# Patient Record
Sex: Male | Born: 1970 | Race: White | Hispanic: No | Marital: Married | Smoking: Never smoker
Health system: Southern US, Community
[De-identification: ages and names within clinical notes are randomized; demographics above are authoritative.]

---

## 2011-04-13 ENCOUNTER — Inpatient Hospital Stay (INDEPENDENT_AMBULATORY_CARE_PROVIDER_SITE_OTHER)
Admission: RE | Admit: 2011-04-13 | Discharge: 2011-04-13 | Disposition: A | Discharge status: Home / Self Care | Payer: BC Managed Care – HMO | Source: Ambulatory Visit | Attending: Family Medicine | Admitting: Family Medicine

## 2011-04-13 DIAGNOSIS — S61209A Unspecified open wound of unspecified finger without damage to nail, initial encounter: Secondary | ICD-10-CM

## 2014-01-12 ENCOUNTER — Telehealth: Payer: Self-pay

## 2014-01-12 MED ORDER — HYDROCHLOROTHIAZIDE 12.5 MG PO CAPS: 12.5000 mg | ORAL_CAPSULE | Freq: Every day | ORAL | Status: DC

## 2014-01-12 NOTE — Telephone Encounter (Signed)
Refilled HCTZ

## 2014-07-10 ENCOUNTER — Other Ambulatory Visit: Payer: Self-pay | Admitting: Cardiology

## 2014-08-05 ENCOUNTER — Other Ambulatory Visit: Payer: Self-pay | Admitting: Cardiology

## 2014-08-08 NOTE — Telephone Encounter (Signed)
Send to PCP Dr. Lupe Carney. Pt last seen Dr. Anne Fu 8/14 and pt only follows up as needed with cardiology.

## 2015-07-06 ENCOUNTER — Ambulatory Visit
Admission: RE | Admit: 2015-07-06 | Discharge: 2015-07-06 | Disposition: A | Discharge status: Home / Self Care | Payer: BC Managed Care – PPO | Source: Ambulatory Visit | Attending: Family Medicine | Admitting: Family Medicine

## 2015-07-06 ENCOUNTER — Other Ambulatory Visit: Payer: Self-pay | Admitting: Family Medicine

## 2015-07-06 DIAGNOSIS — R52 Pain, unspecified: Secondary | ICD-10-CM

## 2015-07-09 ENCOUNTER — Other Ambulatory Visit: Payer: Self-pay | Admitting: Family Medicine

## 2015-07-09 DIAGNOSIS — R7989 Other specified abnormal findings of blood chemistry: Secondary | ICD-10-CM

## 2015-07-09 DIAGNOSIS — D649 Anemia, unspecified: Secondary | ICD-10-CM

## 2015-07-09 DIAGNOSIS — R945 Abnormal results of liver function studies: Principal | ICD-10-CM

## 2015-07-10 ENCOUNTER — Ambulatory Visit
Admission: RE | Admit: 2015-07-10 | Discharge: 2015-07-10 | Disposition: A | Discharge status: Home / Self Care | Payer: BC Managed Care – PPO | Source: Ambulatory Visit | Attending: Family Medicine | Admitting: Family Medicine

## 2015-07-10 DIAGNOSIS — R7989 Other specified abnormal findings of blood chemistry: Secondary | ICD-10-CM

## 2015-07-10 DIAGNOSIS — D649 Anemia, unspecified: Secondary | ICD-10-CM

## 2015-07-10 DIAGNOSIS — R945 Abnormal results of liver function studies: Principal | ICD-10-CM

## 2016-01-08 IMAGING — US US ABDOMEN COMPLETE
1 series · 14 of 25 positions shown · non-contrast
Comparison: None.

CLINICAL DATA: Elevated LFTs, anemia

EXAM:
ULTRASOUND ABDOMEN COMPLETE

[Series 1: us abdomen complete · 0.30mm/px · 14 of 99 slices shown]
[im 1/99]
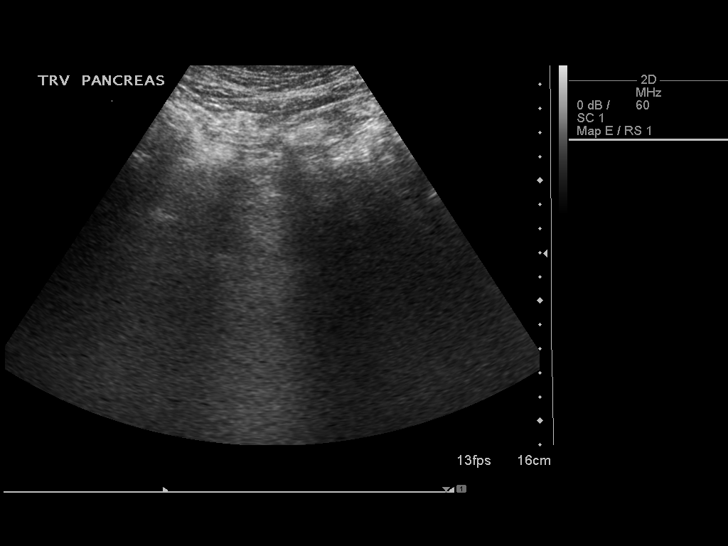
[im 9/99]
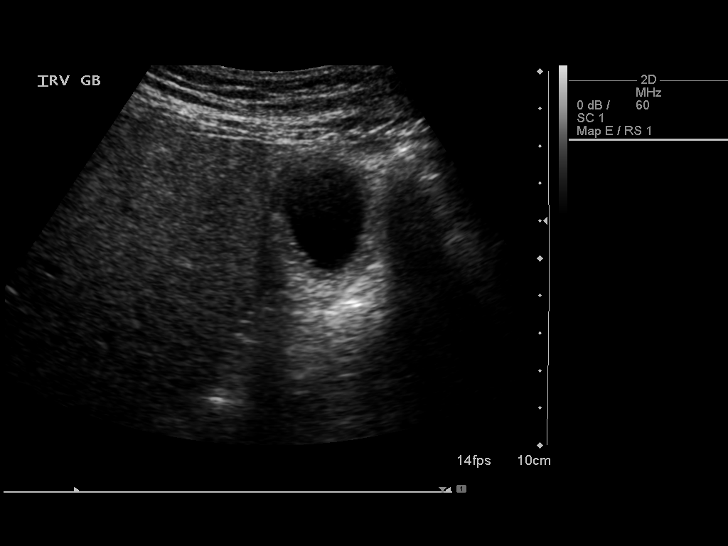
[im 17/99]
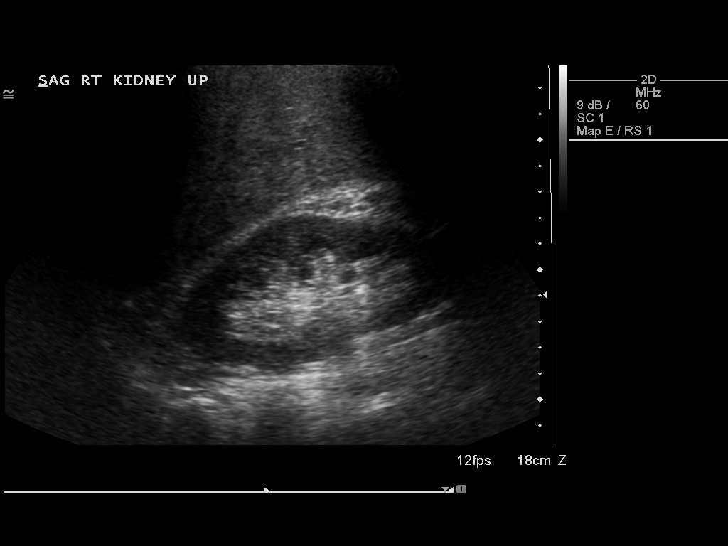
[im 25/99]
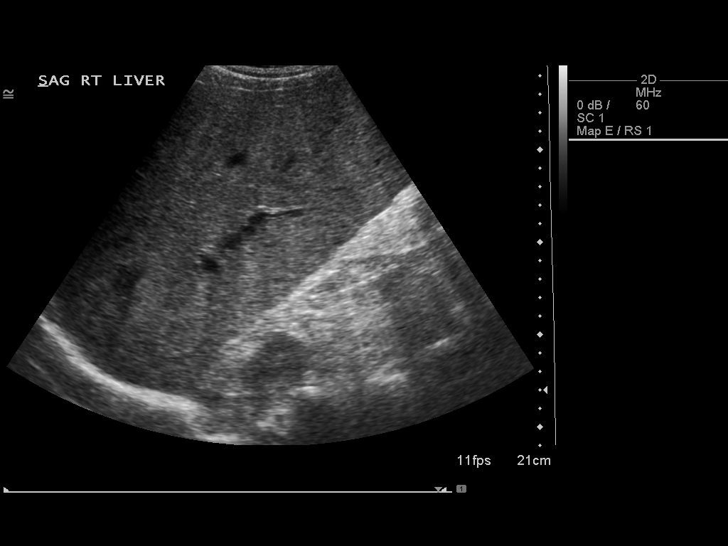
[im 33/99]
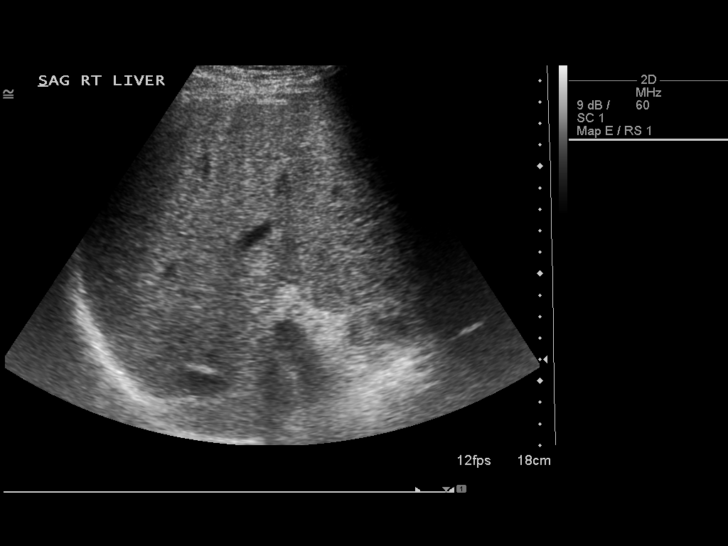
[im 37/99]
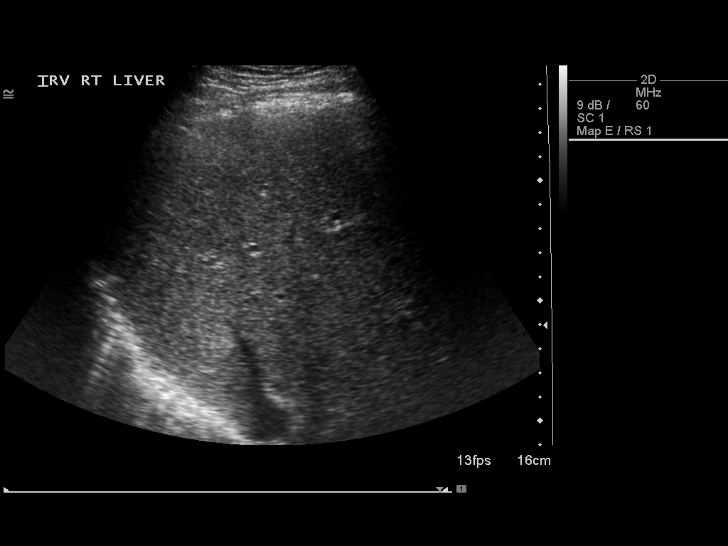
[im 45/99]
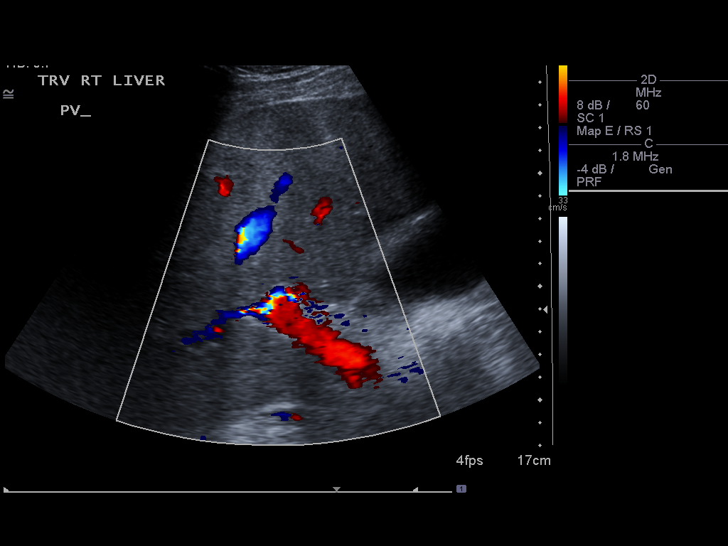
[im 54/99]
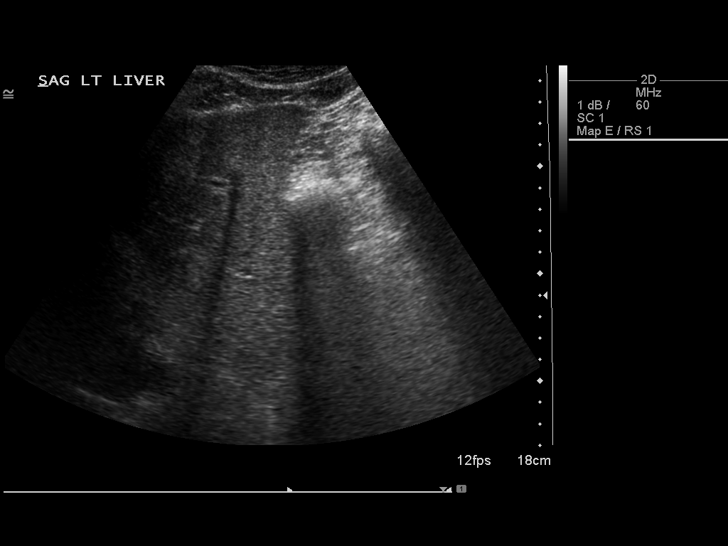
[im 62/99]
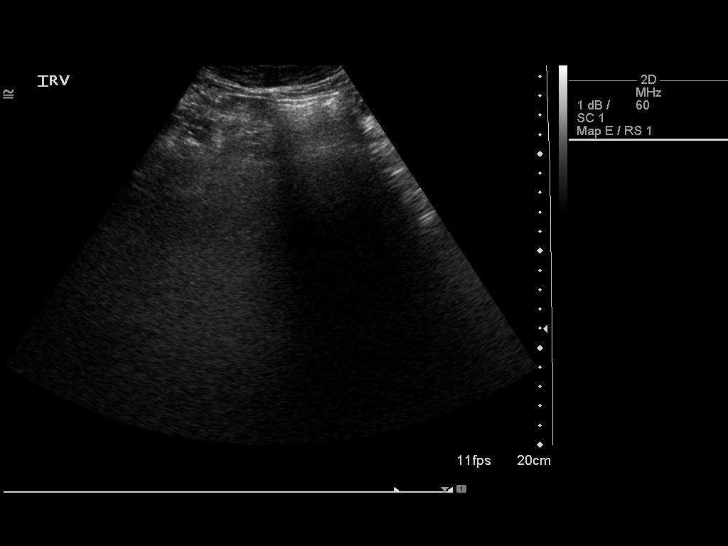
[im 66/99]
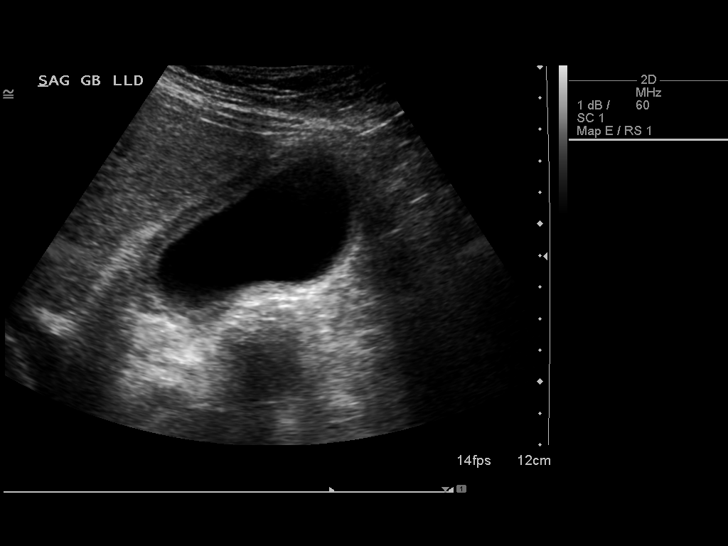
[im 74/99]
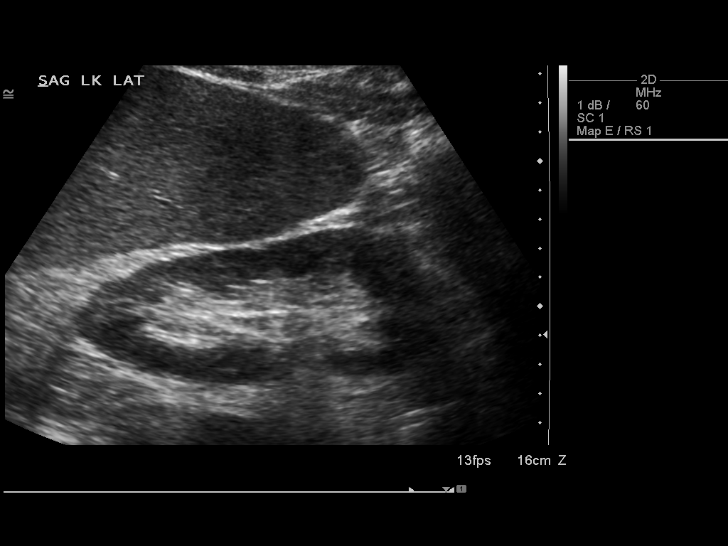
[im 82/99]
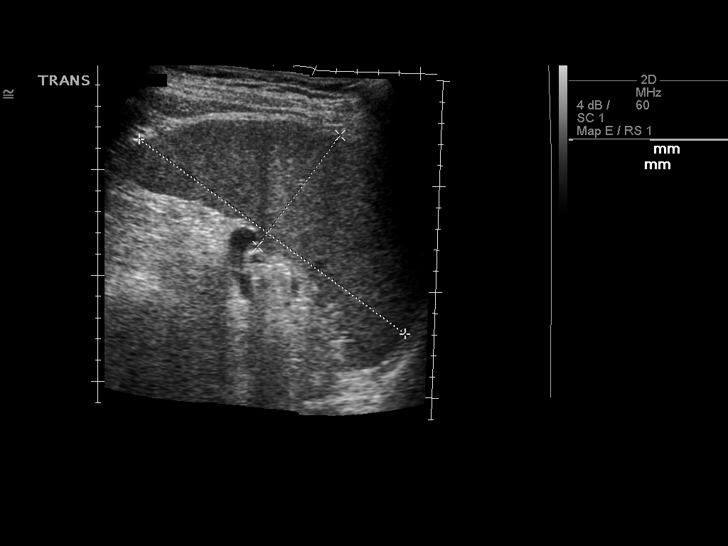
[im 90/99]
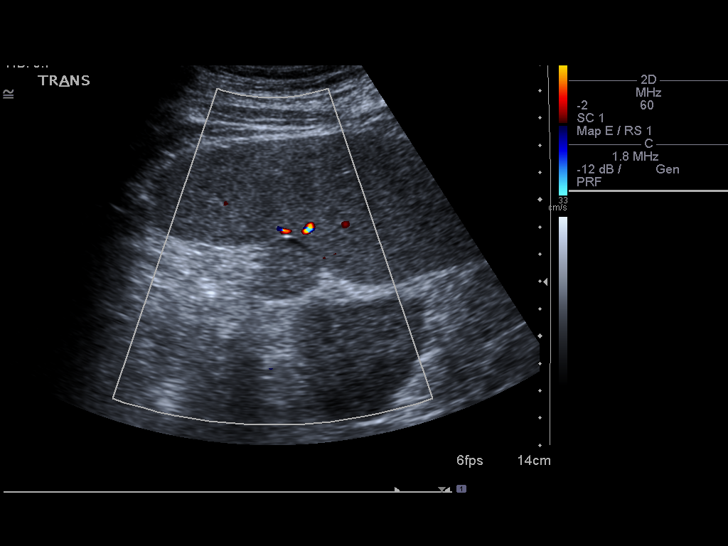
[im 99/99]
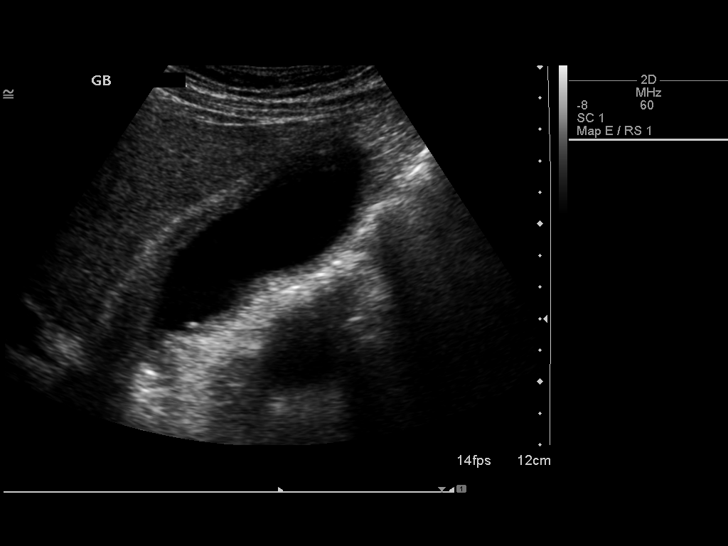

[14 of 25 positions shown; findings below may reference images not displayed]

FINDINGS: Gallbladder: 4 mm gallstone. Mild gallbladder wall thickening. No
pericholecystic fluid. Negative sonographic Murphy's sign. Wall

Common bile duct: Diameter: 4 mm

Liver: No focal lesion identified. Within normal limits in
parenchymal echogenicity. Trace perihepatic fluid.

IVC: No abnormality visualized.

Pancreas: Visualized portion unremarkable.

Spleen: Splenomegaly, measuring 15.0 x 15.6 x 6.5 cm (calculated
volume 761 mL).

Right Kidney: Length: 11.0 cm.  No mass or hydronephrosis.

Left Kidney: Length: 13.4 cm.  No mass or hydronephrosis

Abdominal aorta: Not discretely visualized.

Other findings: None.
IMPRESSION: No focal hepatic lesion is seen.

Splenomegaly.

Cholelithiasis, without associated sonographic findings to suggest
acute cholecystitis.

## 2022-06-26 ENCOUNTER — Encounter: Payer: Self-pay | Admitting: Neurology

## 2022-06-26 ENCOUNTER — Ambulatory Visit (INDEPENDENT_AMBULATORY_CARE_PROVIDER_SITE_OTHER): Payer: BC Managed Care – PPO | Admitting: Neurology

## 2022-06-26 VITALS — BP 146/97 | HR 68 | Ht 69.0 in | Wt 208.5 lb

## 2022-06-26 DIAGNOSIS — G4701 Insomnia due to medical condition: Secondary | ICD-10-CM | POA: Diagnosis not present

## 2022-06-26 DIAGNOSIS — F4310 Post-traumatic stress disorder, unspecified: Secondary | ICD-10-CM | POA: Insufficient documentation

## 2022-06-26 DIAGNOSIS — Z944 Liver transplant status: Secondary | ICD-10-CM

## 2022-06-26 DIAGNOSIS — R0683 Snoring: Secondary | ICD-10-CM | POA: Diagnosis not present

## 2022-06-26 DIAGNOSIS — G8929 Other chronic pain: Secondary | ICD-10-CM | POA: Insufficient documentation
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
## 2022-06-26 NOTE — Patient Instructions (Signed)
Quality Sleep Information, Adult Quality sleep is important for your mental and physical health. It also improves your quality of life. Quality sleep means you: Are asleep for most of the time you are in bed. Fall asleep within 30 minutes. Wake up no more than once a night.  Are awake for no longer than 20 minutes if you do wake up during the night. Most adults need 7-8 hours of quality sleep each night. How can poor sleep affect me? If you do not get enough quality sleep, you may have: Mood swings. Daytime sleepiness. Confusion. Decreased reaction time. Sleep disorders, such as insomnia and sleep apnea. Difficulty with: Solving problems. Coping with stress. Paying attention. These issues may affect your performance and productivity at work, school, and at home. Lack of sleep may also put you at higher risk for accidents, suicide, and risky behaviors. If you do not get quality sleep you may also be at higher risk for several health problems, including: Infections. Type 2 diabetes. Heart disease. High blood pressure. Obesity. Worsening of long-term conditions, like arthritis, kidney disease, depression, Parkinson's disease, and epilepsy. What actions can I take to get more quality sleep?     Stick to a sleep schedule. Go to sleep and wake up at about the same time each day. Do not try to sleep less on weekdays and make up for lost sleep on weekends. This does not work. Try to get about 30 minutes of exercise on most days. Do not exercise 2-3 hours before going to bed. Limit naps during the day to 30 minutes or less. Do not use any products that contain nicotine or tobacco, such as cigarettes or e-cigarettes. If you need help quitting, ask your health care provider. Do not drink caffeinated beverages for at least 8 hours before going to bed. Coffee, tea, and some sodas contain caffeine. Do not drink alcohol close to bedtime. Do not eat large meals close to bedtime. Do not take naps  in the late afternoon. Try to get at least 30 minutes of sunlight every day. Morning sunlight is best. Make time to relax before bed. Reading, listening to music, or taking a hot bath promotes quality sleep. Make your bedroom a place that promotes quality sleep. Keep your bedroom dark, quiet, and at a comfortable room temperature. Make sure your bed is comfortable. Take out sleep distractions like TV, a computer, smartphone, and bright lights. If you are lying awake in bed for longer than 20 minutes, get up and do a relaxing activity until you feel sleepy. Work with your health care provider to treat medical conditions that may affect sleeping, such as: Nasal obstruction. Snoring. Sleep apnea and other sleep disorders. Talk to your health care provider if you think any of your prescription medicines may cause you to have difficulty falling or staying asleep. If you have sleep problems, talk with a sleep consultant. If you think you have a sleep disorder, talk with your health care provider about getting evaluated by a specialist. Where to find more information National Sleep Foundation website: https://sleepfoundation.org National Heart, Lung, and Blood Institute (NHLBI): www.nhlbi.nih.gov/files/docs/public/sleep/healthy_sleep.pdf Centers for Disease Control and Prevention (CDC): www.cdc.gov/sleep/index.html Contact a health care provider if you: Have trouble getting to sleep or staying asleep. Often wake up very early in the morning and cannot get back to sleep. Have daytime sleepiness. Have daytime sleep attacks of suddenly falling asleep and sudden muscle weakness (narcolepsy). Have a tingling sensation in your legs with a strong urge to move your   legs (restless legs syndrome). Stop breathing briefly during sleep (sleep apnea). Think you have a sleep disorder or are taking a medicine that is affecting your quality of sleep. Summary Most adults need 7-8 hours of quality sleep each  night. Getting enough quality sleep is an important part of health and well-being. Make your bedroom a place that promotes quality sleep and avoid things that may cause you to have poor sleep, such as alcohol, caffeine, smoking, and large meals. Talk to your health care provider if you have trouble falling asleep or staying asleep. This information is not intended to replace advice given to you by your health care provider. Make sure you discuss any questions you have with your health care provider. Document Revised: 12/23/2021 Document Reviewed: 09/16/2021 Elsevier Patient Education  2023 Elsevier Inc. Managing Post-Traumatic Stress Disorder If you have been diagnosed with post-traumatic stress disorder (PTSD), you may be relieved that you now know why you have felt or behaved a certain way. Still, you may feel overwhelmed about the treatment ahead. You may also wonder how to get the support you need and how to deal with the condition day-to-day. If you are living with PTSD, there are ways to help you recover from it and manage your symptoms. How to manage lifestyle changes Managing stress Stress is your body's reaction to life changes and events, both good and bad. Stress can make PTSD worse. Take the following steps to manage stress: Talk with your health care provider or a counselor if you would like to learn more about techniques to reduce your stress. He or she may suggest some stress reduction techniques such as: Muscle relaxation exercises. Regular exercise. Meditation, yoga, or other mind-body exercises. Breathing exercises. Listening to quiet music. Spending time outside. Maintain a healthy lifestyle. Eat a healthy diet, exercise regularly, get plenty of sleep, and take time to relax. Spend time with others. Talk with them about how you are feeling and what kind of support you need. Try not to isolate yourself, even though you may feel like doing that. Isolating yourself can delay  your recovery. Do activities and hobbies that you enjoy. Pace yourself when doing stressful things. Take breaks, and reward yourself when you finish. Make sure that you do not overload your schedule.  Medicines Your health care provider may suggest certain medicines if he or she feels that they will help to improve your condition. Medicines for depression (antidepressants) or severe loss of contact with reality (antipsychotics) may be used to treat PTSD. Avoid using alcohol and other substances that may prevent your medicines from working properly. It is also important to: Talk with your pharmacist or health care provider about all medicines that you take, their possible side effects, and which medicines are safe to take together. Make it your goal to take part in all treatment decisions (shared decision-making). Ask about possible side effects of medicines that your health care provider recommends, and tell him or her how you feel about having those side effects. It is best to have shared decision-making with your health care provider as part of your total treatment plan. If your health care provider prescribes a medicine, you may not notice the full benefits of it for 4-8 weeks. Most people who are treated for PTSD need to take medicine for at least 6-12 months before they feel better. If you are taking medicines as part of your treatment, do not stop taking medicines before you ask your health care provider if it is safe  to stop. You may need to have the medicine slowly decreased (tapered) over time to lower the risk of harmful side effects. Relationships Many people who have PTSD have difficulty trusting others. Make an effort to: Take risks and develop trust with close friends and family members. Developing trust in others can help you feel safe and connect you with emotional support. Be open and honest about your feelings. Have fun and relax in safe spaces, such as with friends and family. Think  about going to couples counseling, family education classes, or family therapy. Your family members may not always know how to be supportive. Therapy can be helpful for everyone. How to recognize changes in your condition Be aware of your symptoms and how often you have them. The following symptoms mean that you need to get help for your PTSD: You feel suspicious and angry. You have repeated flashbacks. You avoid going out or being with others. You have an increasing number of fights with close friends or family members. You have thoughts about hurting yourself or others. You cannot get relief from feelings of depression or anxiety. Follow these instructions at home: Lifestyle Exercise regularly. Try to do 30 minutes or more of physical activity on most days of the week. Try to get 7-9 hours of sleep each night. To help with sleep: Keep your bedroom cool and dark. Avoid screen time before bedtime. This means avoiding use of your TV, computer, tablet, and mobile phone. Practice self-soothing skills and use them daily. Try to have fun and find humor in your life. Eating and drinking Do not eat a heavy meal during the hour before you go to bed. Do not drink alcohol or caffeinated drinks before bed. Avoid using alcohol or drugs. General instructions If your PTSD is affecting your marriage or family, get help from a family therapist. Remind yourself that recovering from the trauma is a process and takes time. Take over-the-counter and prescription medicines only as told by your health care provider. Make sure to let all of your health care providers know that you have PTSD. This is especially important if you are having surgery or need to be admitted to the hospital. Keep all follow-up visits. This is important. Where to find support Talking to others Explain that PTSD is a mental health problem. It is something that a person can develop after experiencing or seeing a traumatic event. Tell  others that PTSD makes you feel stress like you did during the event. Talk to your family members and friends about the symptoms you have. Also, tell them what things or situations can cause symptoms to start (are triggers for you). Assure your family members that there are treatments to help PTSD. Discuss possibly getting family therapy or couples therapy. If you are worried or fearful about getting treatment, ask for support. Keep daily contact with at least one trusted friend or family member. Finances Not all insurance plans cover mental health care, so it is important to check with your insurance carrier. If paying for co-pays or counseling services is a problem, search for a local or county mental health care center. Public mental health care services may be offered at those places at a low cost or no cost when you are not able to see a private health care provider. If you are a veteran, contact a local veterans organization or veterans hospital for more information. If you are taking medicine for PTSD, you may be able to get the genericform, which may be  less expensive than brand-name medicine. Some makers of prescription medicines also offer help to people who cannot afford the medicines that they need. Therapy and support groups Find a support group in your community. Often, groups are available for [employer] veterans, trauma victims, and family members or caregivers. Look into volunteer opportunities. Taking part in these can help you feel more connected to your community. Contact a local organization to find out if you are eligible for a service dog. Where to find more information Go to these websites to find more information about PTSD, treatment of PTSD, and how to get support: [employer] of Mental Health: http://www.maynard.net/ National Center for PTSD: www.ptsd.FitBoxer.tn Contact a health care provider if: Your symptoms get worse or do not get better. You are feeling overwhelmed by your  symptoms. Get help right away if: You have thoughts about hurting yourself or others. Get help right away if you feel like you may hurt yourself or others, or have thoughts about taking your own life. Go to your nearest emergency room or: Call 911. Call the National Suicide Prevention Lifeline at 815-124-2706 or 988. This is open 24 hours a day. Text the Crisis Text Line at 401-688-9932. Summary If you are living with PTSD, there are ways to help you recover from it and manage your symptoms. Find supportive environments and people who understand PTSD. Spend time in those places, and maintain contact with those people. Work with your health care team to create a plan for managing PTSD. The plan should include counseling, stress reduction techniques, and healthy lifestyle habits. This information is not intended to replace advice given to you by your health care provider. Make sure you discuss any questions you have with your health care provider. Document Revised: 09/04/2021 Document Reviewed: 09/23/2021 Elsevier Patient Education  2023 [employer].

## 2022-06-30 ENCOUNTER — Other Ambulatory Visit: Payer: Self-pay | Admitting: *Deleted

## 2022-06-30 ENCOUNTER — Telehealth: Payer: Self-pay | Admitting: Neurology

## 2022-06-30 DIAGNOSIS — R0683 Snoring: Secondary | ICD-10-CM

## 2022-06-30 DIAGNOSIS — G8929 Other chronic pain: Secondary | ICD-10-CM

## 2022-06-30 NOTE — Telephone Encounter (Signed)
I don't see an order for a inattended sleep study that Dr. Vickey Huger suggested in her notes. When you get a chance can you put a order in thank you.

## 2022-07-29 NOTE — Addendum Note (Signed)
Addended by: Arther Abbott on: 07/29/2022 10:58 AM   Modules accepted: Orders

## 2022-07-29 NOTE — Telephone Encounter (Signed)
Noted, thank you.  LVM for pt to call back to schedule   [employer] auth: [id number] (exp. 07/29/22 to 09/26/22

## 2022-08-06 NOTE — Telephone Encounter (Signed)
sent info to the [place] for a PA

## 2022-08-18 NOTE — Telephone Encounter (Signed)
LVM for pt to call back to schedule sleep study.  

## 2022-10-01 ENCOUNTER — Telehealth: Payer: Self-pay

## 2022-10-01 NOTE — Telephone Encounter (Signed)
Called pt to informed that he needs to r/s till he has his sleep study done. Pt verbalized understanding. Pt had no questions at this time but was encouraged to call back if questions arise.

## 2022-10-02 ENCOUNTER — Ambulatory Visit: Payer: BC Managed Care – PPO | Admitting: Neurology

## 2023-03-26 NOTE — Telephone Encounter (Signed)
[facility] sent a [facility]/Auth for the sleep study but it is going to expire in May and I am not able to get him in before then. I submitted form for it to be extended for me to be able to get the SS scheduled.   [facility] is pending right now.

## 2023-04-16 ENCOUNTER — Telehealth: Payer: Self-pay | Admitting: Neurology

## 2023-04-16 NOTE — Telephone Encounter (Signed)
[employer]- [place] [facility]: [id number] (exp. 03/27/23 to 09/23/23)- T[facility] w[facility]the [facility] schedule this appt   [employer] [place] Numbers [id number] (Exp. 04/16/23 to 06/14/23)   I spoke with Tiffany with the [facility] and she scheduled his appointment for 06/22/2023 at 8 pm because he can only do Monday, Wednesday and Friday nights.   I informed her that I would also mail him a pack with his appointment information.

## 2023-05-11 NOTE — Telephone Encounter (Signed)
Updated [employer] insurance   [place]: [id number] (exp. 05/11/23 to 07/09/23)

## 2023-06-22 ENCOUNTER — Ambulatory Visit (INDEPENDENT_AMBULATORY_CARE_PROVIDER_SITE_OTHER): Payer: No Typology Code available for payment source | Admitting: Neurology

## 2023-06-22 DIAGNOSIS — R0683 Snoring: Secondary | ICD-10-CM

## 2023-06-22 DIAGNOSIS — G8929 Other chronic pain: Secondary | ICD-10-CM

## 2023-07-04 NOTE — Progress Notes (Signed)
The quality of sleep is not influenced by a primary sleep disorder.    No organic sleep disorder was identified, sleep onset insomnia was noted. The patient reported of back pain affecting the quality of sleep but also had a different sleep perception. 1) Sleep disordered breathing was not present. No apnea, no hypoxia , only mildest snoring.  2) Periodic limb movements in sleep were not seen.  3) Sleep efficiency was reduced by a prolonged sleep latency.  Treatment of chronic pain through his [facility] providers is the main recommendation. He would not need to follow up with our sleep clinic.  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

## 2023-07-04 NOTE — Procedures (Signed)
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 He has a history of PTSD/ nightmares and reportedly insomnia and fatigue- Here for sleep consult due to fatigue, insomnia, in the past with excessive daytime sleepiness. 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 Pt had an at home sleep study through the [place]  ending with equipment failure. 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 He reports poor sleep quality because of nightmares or pain- and he is fatigued, uses copious amounts of caffeine. He doesn't snore".  ADDITIONAL INFORMATION:  The Epworth Sleepiness Scale endorsed at 1 /24 points (scores above or equal to 10 are suggestive of hypersomnolence). FSS endorsed at 43 /63 points. Height: 69 in Weight: 208 lbs (BMI 30) Neck Size: 17 in  MEDICATIONS: Aspirin, Glucophage, Cellcept, Zoloft, Prograf, Trazodone  TECHNICAL DESCRIPTION: A registered sleep technologist ( RPSGT)  was in attendance for the duration of the recording.  Data collection, scoring, video monitoring, and reporting were performed in compliance with the AASM Manual for the Scoring of Sleep and Associated Events; (Hypopnea is scored based on the criteria listed in Section VIII D. 1b in the AASM Manual V2.6 using a 4% oxygen desaturation rule or Hypopnea is scored based on the criteria listed in Section VIII D. 1a in the AASM Manual V2.6 using 3% oxygen desaturation and /or arousal rule).   SLEEP CONTINUITY AND SLEEP ARCHITECTURE:   Lights-out was at 21:10: and lights-on at  05:07:, with  7.9 hours  of recording time . Total sleep time ( TST) was 361.5 minutes with a decreased sleep efficiency at 75.9%. Sleep latency was increased at 82.5 minutes.   REM sleep latency was increased at 308.0 minutes.  Of the total sleep time, the percentage of stage N1 sleep was 2.6%, stage N2 sleep was 61%, stage N3 sleep was 29.3%, and REM sleep was 7.1%. There were 2 Stage R periods observed on this study night, 8 awakenings (i.e. transitions to Stage W from any sleep stage), and 28 total stage transitions. Wake after sleep onset (WASO) time accounted for 32.5 minutes.  BODY POSITION:  TST was divided between the following sleep positions: In supine for 00 minutes (0%), in non-supine 362 minutes (100%); right 187 minutes (52%), left 00 minutes (0%), and in prone 174 minutes (48%). Total supine REM sleep time was 00 minutes (0% of total REM sleep).     RESPIRATORY MONITORING:   Based on AASM criteria (using a 3% oxygen desaturation and /or arousal rule for scoring hypopneas), there was 1 apnea (1 obstructive; 0 central; 0 mixed), and 2 hypopneas. Apnea index was 0.2/h. Hypopnea index was 0.3/h.  The AHI (apnea-hypopnea index) was 0.5/h overall (0.0 supine, 5 non-supine; 4.7 REM, 0.0 supine REM).There were 0 respiratory effort-related arousals (RERAs).  The RERA index was 0 events/h. and only mild snoring was captured while on his right side OXIMETRY: Oxyhemoglobin Saturation Nadir during  sleep was at  93% from a mean of 96%.  Of the Total sleep time (TST), hypoxemia (<89%) was present for  0.0 minutes, or 0.0% of total sleep time.  LIMB MOVEMENTS: There were 0 periodic limb movements of sleep (0.0/h), of which 0 (0.0/h) were associated with an arousal. AROUSAL: There were 32 arousals in total, for an arousal index of 4 arousals/hour.  Of these, 2 were identified as respiratory-related arousals (0 /h), 0 were PLM-related arousals (0 /h), and 30  were non-specific arousals (5 /h). There were 0 occurrences of Cheyne Stokes breathing.   EEG:  PSG EEG was of normal amplitude and frequency, with symmetric manifestation of sleep stages. EKG: The electrocardiogram documented a regular rhythm.  The average heart rate during sleep was 60 bpm.  The heart rate during sleep varied between a minimum of 53 and  a maximum of  69 bpm. AUDIO and VIDEO: frequent movements during wakefulness but few during sleep- no PLMs.   IMPRESSION: there was a very prolonged sleep latency followed by unfragmented sleep with an unusual sleep architecture due to disproportionally high amount of Delta or Slow Wave Sleep, N3 sleep.  1) Sleep disordered breathing was not present. No apnea, no hypoxia , mildest snoring.  2) Periodic limb movements in sleep were not seen.  3) Sleep efficiency was reduced by a prolonged sleep latency. There was only one bathroom break. Total sleep time was within normal limits at 361.5 minutes. Sleep was not fragmented. The patient slept well in prone position and reached SWS or N3 sleep, the deepest sleep stage and the one associated with parasomnia activity. The patient did not vocalize and did not display complex motor activity.  Only mild snoring was recorded, and this was not continuous.  Sleep efficiency was decreased at 75.9%.    RECOMMENDATIONS: The quality of sleep is not influenced by a primary sleep disorder.    No organic sleep disorder was identified, sleep onset insomnia was noted. The patient reported of back pain affecting the quality of sleep but also had a different sleep perception: He subjectively felt having fallen asleep after 15 minutes, and correctly estimated having slept for about 6.5 hours.    Terry Novas, MD            General Information  Name: Terry George BMI: 30.72 Physician: Terry Novas, MD  ID: [id number] Height: 69.0 in Technician: Terry George, RPSGT  Sex: Male Weight: 208.0 lb Record:  [id number]  Age: 22 [Aug 09, 1971] Date: 06/22/2023    Medical & Medication History    Terry George is a 52 y.o. Caucasian male [facility] seen here upon [place] referral on 06/26/2022 Chief concern according to patient : " Here for sleep consult due to fatigue , insomnia, daytime sleepiness, sleep study required for CDL/FAA. Pt had an at home sleep study and had equipment failure. 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 He reports poor sleep quality because of night me ares or pain- and he is fatigued, copious amounts of caffeine. He doesn't snore. Gwenlyn Perking. Kimber "has no past medical history on file". The patient had attempted a HST.  Aspirin, Glucophage, Cellcept, Zoloft, Prograf, Trazodone   Sleep Disorder      Comments       Lights out: 09:10:29 PM Lights on: 05:07:04 AM   Time Total Supine Side Prone Upright  Recording (TRT) 7h 56.33m 1h 5.55m 3h 36.26m 3h 15.74m 0h 0.1m  Sleep (TST) 6h 1.12m 0h  0.40m 3h 7.30m 2h 54.69m 0h 0.65m   Latency N1 N2 N3 REM Onset Per. Slp. Eff.  Actual 1h 22.29m 1h 24.24m 1h 47.35m 5h 8.22m 1h 22.57m 1h 22.7m 75.87%   Stg Dur Wake N1 N2 N3 REM  Total 32.5 9.5 220.5 106.0 25.5  Supine 0.0 0.0 0.0 0.0 0.0  Side 27.5 6.5 164.0 0.0 17.0  Prone 5.0 3.0 56.5 106.0 8.5  Upright 0.0 0.0 0.0 0.0 0.0   Stg % Wake N1 N2 N3 REM  Total 8.2 2.6 61.0 29.3 7.1  Supine 0.0 0.0 0.0 0.0 0.0  Side 7.0 1.8 45.4 0.0 4.7  Prone 1.3 0.8 15.6 29.3 2.4  Upright 0.0 0.0 0.0 0.0 0.0     Apnea Summary Sub Supine Side Prone Upright  Total 1 Total 1 0 1 0 0    REM 0 0 0 0 0    NREM 1 0 1 0 0  Obs 1 REM 0 0 0 0 0    NREM 1 0 1 0 0  Mix 0 REM 0 0 0 0 0    NREM 0 0 0 0 0  Cen 0 REM 0 0 0 0 0    NREM 0 0 0 0 0   Rera Summary Sub Supine Side Prone Upright  Total 0 Total 0 0 0 0 0    REM 0 0 0 0 0    NREM 0 0 0 0 0   Hypopnea Summary Sub Supine Side Prone Upright  Total 2 Total 2 0 2 0 0    REM 2 0 2 0 0    NREM 0 0 0 0 0   4% Hypopnea Summary Sub  Supine Side Prone Upright  Total (4%) 1 Total 1 0 1 0 0    REM 1 0 1 0 0    NREM 0 0 0 0 0     AHI Total Obs Mix Cen  0.50 Apnea 0.17 0.17 0.00 0.00   Hypopnea 0.33 -- -- --  0.33 Hypopnea (4%) 0.17 -- -- --    Total Supine Side Prone Upright  Position AHI 0.50 0.00 0.96 0.00 0.00  REM AHI 4.71   NREM AHI 0.18   Position RDI 0.50 0.00 0.96 0.00 0.00  REM RDI 4.71   NREM RDI 0.18    4% Hypopnea Total Supine Side Prone Upright  Position AHI (4%) 0.33 0.00 0.64 0.00 0.00  REM AHI (4%) 2.35   NREM AHI (4%) 0.18   Position RDI (4%) 0.33 0.00 0.64 0.00 0.00  REM RDI (4%) 2.35   NREM RDI (4%) 0.18    Desaturation Information Threshold: 2% <100% <90% <80% <70% <60% <50% <40%  Supine 0.0 0.0 0.0 0.0 0.0 0.0 0.0  Side 12.0 0.0 0.0 0.0 0.0 0.0 0.0  Prone 7.0 0.0 0.0 0.0 0.0 0.0 0.0  Upright 0.0 0.0 0.0 0.0 0.0 0.0 0.0  Total 19.0 0.0 0.0 0.0 0.0 0.0 0.0  Index 2.9 0.0 0.0 0.0 0.0 0.0 0.0   Threshold: 3% <100% <90% <80% <70% <60% <50% <40%  Supine 0.0 0.0 0.0 0.0 0.0 0.0 0.0  Side 2.0 0.0 0.0 0.0 0.0 0.0 0.0  Prone 3.0 0.0 0.0 0.0 0.0 0.0 0.0  Upright 0.0 0.0 0.0 0.0 0.0 0.0 0.0  Total 5.0 0.0 0.0 0.0 0.0 0.0 0.0  Index 0.8 0.0 0.0 0.0 0.0 0.0 0.0   Threshold: 4% <100% <90% <80% <70% <60% <50% <40%  Supine 0.0 0.0 0.0 0.0 0.0  0.0 0.0  Side 1.0 0.0 0.0 0.0 0.0 0.0 0.0  Prone 1.0 0.0 0.0 0.0 0.0 0.0 0.0  Upright 0.0 0.0 0.0 0.0 0.0 0.0 0.0  Total 2.0 0.0 0.0 0.0 0.0 0.0 0.0  Index 0.3 0.0 0.0 0.0 0.0 0.0 0.0   Threshold: 4% <100% <90% <80% <70% <60% <50% <40%  Supine 0 0 0 0 0 0 0  Side 1 0 0 0 0 0 0  Prone 1 0 0 0 0 0 0  Upright 0 0 0 0 0 0 0  Total 2 0 0 0 0 0 0   Awakening/Arousal Information # of Awakenings 8  Wake after sleep onset 32.70m  Wake after persistent sleep 32.19m   Arousal Assoc. Arousals Index  Apneas 1 0.2  Hypopneas 1 0.2  Leg Movements 0 0.0  Snore 0 0.0  PTT Arousals 0 0.0  Spontaneous 30 5.0  Total 32 5.3  Leg Movement Information PLMS LMs  Index  Total LMs during PLMS 0 0.0  LMs w/ Microarousals 0 0.0   LM LMs Index  w/ Microarousal 0 0.0  w/ Awakening 0 0.0  w/ Resp Event 0 0.0  Spontaneous 0 0.0  Total 0 0.0     Desaturation threshold setting: 4% Minimum desaturation setting: 10 seconds SaO2 nadir: 91% The longest event was a 28 sec obstructive Hypopnea with a minimum SaO2 of 94%. The lowest SaO2 was 93% associated with a 25 sec obstructive Hypopnea. EKG Rates EKG Avg Max Min  Awake 60 74 55  Asleep 60 69 53  EKG Events: N/A

## 2023-07-06 ENCOUNTER — Encounter: Payer: Self-pay | Admitting: Neurology

## 2023-07-07 NOTE — Telephone Encounter (Signed)
Pt returned call and I was able to discuss his sleep study results with him. Advised there was no evidence of a sleep disorder breathing. Pt verbalized understanding. Pt had no questions at this time but was encouraged to call back if questions arise. I have advised a copy will be sent to the pcp on file for her records
